# Patient Record
Sex: Male | Born: 1985 | Race: Black or African American | Hispanic: No | Marital: Single
Health system: Southern US, Community
[De-identification: ages and names within clinical notes are randomized; demographics above are authoritative.]

---

## 2018-09-20 ENCOUNTER — Other Ambulatory Visit: Payer: Self-pay

## 2018-09-20 ENCOUNTER — Encounter (HOSPITAL_COMMUNITY): Payer: Self-pay

## 2018-09-20 ENCOUNTER — Emergency Department (HOSPITAL_COMMUNITY)
Admission: EM | Admit: 2018-09-20 | Discharge: 2018-09-21 | Disposition: A | Discharge status: Home / Self Care | Payer: No Typology Code available for payment source | Attending: Emergency Medicine | Admitting: Emergency Medicine

## 2018-09-20 ENCOUNTER — Emergency Department (HOSPITAL_COMMUNITY): Payer: No Typology Code available for payment source

## 2018-09-20 DIAGNOSIS — Y939 Activity, unspecified: Secondary | ICD-10-CM | POA: Insufficient documentation

## 2018-09-20 DIAGNOSIS — S43214A Anterior dislocation of right sternoclavicular joint, initial encounter: Secondary | ICD-10-CM | POA: Diagnosis not present

## 2018-09-20 DIAGNOSIS — W01198A Fall on same level from slipping, tripping and stumbling with subsequent striking against other object, initial encounter: Secondary | ICD-10-CM | POA: Insufficient documentation

## 2018-09-20 DIAGNOSIS — Y990 Civilian activity done for income or pay: Secondary | ICD-10-CM | POA: Insufficient documentation

## 2018-09-20 DIAGNOSIS — Y9259 Other trade areas as the place of occurrence of the external cause: Secondary | ICD-10-CM | POA: Diagnosis not present

## 2018-09-20 DIAGNOSIS — S43014A Anterior dislocation of right humerus, initial encounter: Secondary | ICD-10-CM

## 2018-09-20 DIAGNOSIS — S40911A Unspecified superficial injury of right shoulder, initial encounter: Secondary | ICD-10-CM | POA: Diagnosis present

## 2018-09-20 MED ORDER — PROPOFOL 10 MG/ML IV BOLUS
0.5000 mg/kg | Freq: Once | INTRAVENOUS | Status: AC
  Administered 2018-09-20: 42 mg via INTRAVENOUS
  Filled 2018-09-20: qty 20

## 2018-09-20 MED ORDER — PROPOFOL 10 MG/ML IV BOLUS
INTRAVENOUS | Status: AC | PRN
  Administered 2018-09-20: 40 mg via INTRAVENOUS
  Administered 2018-09-20: 20 mg via INTRAVENOUS

## 2018-09-20 MED ORDER — FENTANYL CITRATE (PF) 100 MCG/2ML IJ SOLN
100.0000 ug | Freq: Once | INTRAMUSCULAR | Status: AC
  Administered 2018-09-20: 100 ug via INTRAVENOUS
  Filled 2018-09-20: qty 2

## 2018-09-20 NOTE — ED Triage Notes (Signed)
Pt coming in from work after falling c/o right shoulder pain. Unable to rotate shoulder. Able to feel and move fingers.

## 2018-09-20 NOTE — ED Provider Notes (Signed)
[facility]-EMERGENCY DEPT Provider Note   CSN: [id number] Arrival date & time: 09/20/18  2118     History   Chief Complaint Chief Complaint  Patient presents with  . Shoulder Pain    HPI Justin Moran is a 33 y.o. male with no pertinent past medical history who presents to the emergency department with a chief complaint of fall.  The patient reports that he was at work when he lost his footing and fell backwards against a wooden pallet.  He denies LOC, headache, nausea, or emesis, neck pain, numbness, or weakness.  He reports sudden onset right shoulder pain and deformity.  He states he is unable to rotate his right shoulder.  He thinks that he may have hit his right shoulder on the pallet. no history of previous surgery, injury, or dislocation.  No treatment prior to arrival.  The history is provided by the patient. No language interpreter was used.    History reviewed. No pertinent past medical history.  There are no active problems to display for this patient.   History reviewed. No pertinent surgical history.      Home Medications    Prior to Admission medications   Not on File    Family History No family history on file.  Social History Social History   Tobacco Use  . Smoking status: Not on file  Substance Use Topics  . Alcohol use: Not on file  . Drug use: Not on file     Allergies   Patient has no allergy information on record.   Review of Systems Review of Systems  Constitutional: Negative for appetite change and fever.  HENT: Negative for congestion.   Respiratory: Negative for shortness of breath and wheezing.   Cardiovascular: Negative for chest pain.  Gastrointestinal: Negative for abdominal pain, diarrhea, nausea and vomiting.  Genitourinary: Negative for dysuria.  Musculoskeletal: Positive for arthralgias, joint swelling and myalgias. Negative for back pain, neck pain and neck stiffness.  Skin: Negative for rash.    Allergic/Immunologic: Negative for immunocompromised state.  Neurological: Negative for syncope, weakness, numbness and headaches.  Psychiatric/Behavioral: Negative for confusion.     Physical Exam Updated Vital Signs BP 135/68   Pulse 72   Temp 97.7 F (36.5 C) (Oral)   Resp 14   Ht 6\' 1"  (1.854 m)   Wt 83.9 kg   SpO2 99%   BMI 24.41 kg/m   Physical Exam Vitals signs and nursing note reviewed.  Constitutional:      Appearance: He is well-developed.  HENT:     Head: Normocephalic.  Eyes:     Extraocular Movements: Extraocular movements intact.     Conjunctiva/sclera: Conjunctivae normal.     Pupils: Pupils are equal, round, and reactive to light.  Neck:     Musculoskeletal: Neck supple.  Cardiovascular:     Rate and Rhythm: Normal rate and regular rhythm.     Heart sounds: No murmur.  Pulmonary:     Effort: Pulmonary effort is normal. No respiratory distress.     Breath sounds: No stridor. No wheezing, rhonchi or rales.  Chest:     Chest wall: No tenderness.  Abdominal:     General: There is no distension.     Palpations: Abdomen is soft. There is no mass.     Tenderness: There is no abdominal tenderness. There is no right CVA tenderness, left CVA tenderness, guarding or rebound.     Hernia: No hernia is present.  Musculoskeletal:  General: Tenderness and deformity present.     Comments: Obvious deformity to the right shoulder.  Decreased range of motion secondary to deformity and pain.  No tenderness to the right elbow or wrist.  Radial pulses are 2+ and symmetric.  Good capillary refill to all digits of the right hand.  Sensation is intact and equal to the bilateral upper extremities.  Normal exam of the left upper extremity.  No tenderness to the cervical, thoracic, or lumbar spinous processes or paraspinal muscles bilaterally.  Skin:    General: Skin is warm and dry.  Neurological:     Mental Status: He is alert.  Psychiatric:        Behavior:  Behavior normal.    ED Treatments / Results  Labs (all labs ordered are listed, but only abnormal results are displayed) Labs Reviewed - No data to display  EKG None  Radiology Dg Shoulder Right  Result Date: 09/20/2018 CLINICAL DATA:  Fall.  Right shoulder pain EXAM: RIGHT SHOULDER - 2+ VIEW COMPARISON:  None. FINDINGS: There is anterior dislocation of the humeral head. No visible fracture. AC joint appears intact. IMPRESSION: Anterior right shoulder dislocation. Electronically Signed   By: Charlett Nose M.D.   On: 09/20/2018 22:34   Dg Shoulder Right Portable  Result Date: 09/21/2018 CLINICAL DATA:  Postreduction EXAM: PORTABLE RIGHT SHOULDER COMPARISON:  09/20/2018 FINDINGS: Interval reduction of the dislocated right glenohumeral joint. Normal AP alignment. No visible fracture. IMPRESSION: Interval reduction.  No visible fracture. Electronically Signed   By: Charlett Nose M.D.   On: 09/21/2018 00:49    Procedures Procedures (including critical care time)  Medications Ordered in ED Medications  ibuprofen (ADVIL,MOTRIN) tablet 600 mg (has no administration in time range)  fentaNYL (SUBLIMAZE) injection 100 mcg (100 mcg Intravenous Given 09/20/18 2232)  propofol (DIPRIVAN) 10 mg/mL bolus/IV push 42 mg (42 mg Intravenous Given 09/20/18 2345)  propofol (DIPRIVAN) 10 mg/mL bolus/IV push (20 mg Intravenous Given 09/20/18 2349)     Initial Impression / Assessment and Plan / ED Course  I have reviewed the triage vital signs and the nursing notes.  Pertinent labs & imaging results that were available during my care of the patient were reviewed by me and considered in my medical decision making (see chart for details).     33 year old male with no pertinent past medical history presenting with a chief complaint of fall.  He had a mechanical fall backwards and hit his right shoulder.  Right shoulder x-ray with anterior right shoulder dislocation.  On exam, he is neurovascularly intact.  Patient  unable to tolerate attempt at reduction without conscious sedation.  The patient was seen and evaluated along with Dr. Judd Lien, attending physician, who performed conscious sedation and right shoulder reduction.  Please see his note for complete procedure.  The patient was placed in a sling immobilizer.  Postreduction x-ray with interval reduction.  No visible fracture.  Patient is awake and talking.  Pain is mild, but he is requesting ibuprofen, which has been ordered.  Will provide the patient with a referral to orthopedics.  Strict return precautions given.  He is hemodynamically stable and remains neurovascularly intact.  He is safe for discharge to home with outpatient follow-up.   Final Clinical Impressions(s) / ED Diagnoses   Final diagnoses:  Anterior dislocation of right shoulder, initial encounter    ED Discharge Orders    None       Rawleigh Rode A, PA-C 09/21/18 0100    Delo, Riley Lam,  MD 09/21/18 [id number]

## 2018-09-20 NOTE — ED Notes (Signed)
Bed: WA04 Expected date:  Expected time:  Means of arrival:  Comments: 

## 2018-09-20 NOTE — ED Notes (Signed)
Pt given ice for shoulder and dressed into a gown.

## 2018-09-21 ENCOUNTER — Emergency Department (HOSPITAL_COMMUNITY): Payer: No Typology Code available for payment source

## 2018-09-21 MED ORDER — IBUPROFEN 200 MG PO TABS: 600.0000 mg | ORAL_TABLET | Freq: Once | ORAL | Status: DC

## 2018-09-21 NOTE — Discharge Instructions (Addendum)
Thank you for allowing me to care for you today in the Emergency Department.   Keep the sling on at all times until you are seen and evaluated early next week by orthopedics in the clinic.  You can take 650 mg of Tylenol or 600 mg of ibuprofen once every 6 hours or alternate between the 2 medications every 3 hours for pain control.  You can apply ice packs to areas that are sore for 15 to 20 minutes as frequently as needed.  Return to the emergency department if your right upper arm turns red and becomes very swollen, if you develop new numbness or weakness, have any fall or injury, if your right shoulder dislocates again, or if you develop other new, concerning symptoms.

## 2018-09-21 NOTE — ED Notes (Signed)
Portable xray at bedside.

## 2019-11-15 ENCOUNTER — Ambulatory Visit: Payer: Self-pay | Attending: Internal Medicine

## 2019-11-15 DIAGNOSIS — Z23 Encounter for immunization: Secondary | ICD-10-CM

## 2019-11-15 NOTE — Progress Notes (Signed)
   Covid-19 Vaccination Clinic  Name:  Macarthur Lorusso    MRN: [id number] DOB: November 18, 1985  11/15/2019  Mr. Macrae was observed post Covid-19 immunization for 15 minutes without incident. He was provided with Vaccine Information Sheet and instruction to access the V-Safe system.   Mr. Kats was instructed to call 911 with any severe reactions post vaccine: Marland Kitchen Difficulty breathing  . Swelling of face and throat  . A fast heartbeat  . A bad rash all over body  . Dizziness and weakness   Immunizations Administered    Name Date Dose VIS Date Route   Pfizer COVID-19 Vaccine 11/15/2019  1:55 PM 0.3 mL 07/25/2019 Intramuscular   Manufacturer: [employer], [employer]   Lot: [id number]   NDC: [address]-2

## 2019-12-10 ENCOUNTER — Ambulatory Visit: Payer: Self-pay | Attending: Internal Medicine

## 2019-12-10 DIAGNOSIS — Z23 Encounter for immunization: Secondary | ICD-10-CM

## 2019-12-10 NOTE — Progress Notes (Signed)
   Covid-19 Vaccination Clinic  Name:  Justin Moran    MRN: [id number] DOB: November 07, 1985  12/10/2019  [name] was observed post Covid-19 immunization for 15 minutes without incident. He was provided with Vaccine Information Sheet and instruction to access the V-Safe system.   [name] was instructed to call 911 with any severe reactions post vaccine: Marland Kitchen Difficulty breathing  . Swelling of face and throat  . A fast heartbeat  . A bad rash all over body  . Dizziness and weakness   Immunizations Administered    Name Date Dose VIS Date Route   Pfizer COVID-19 Vaccine 12/10/2019  4:18 PM 0.3 mL 10/08/2018 Intramuscular   Manufacturer: [employer], [employer]   Lot: [id number]   NDC: [address]-8

## 2020-04-12 IMAGING — DX DG SHOULDER 2+V PORT*R*
2 series · 2 of 2 positions shown · non-contrast
Comparison: 09/20/2018

CLINICAL DATA: Postreduction

EXAM:
PORTABLE RIGHT SHOULDER

[shoulder ap]
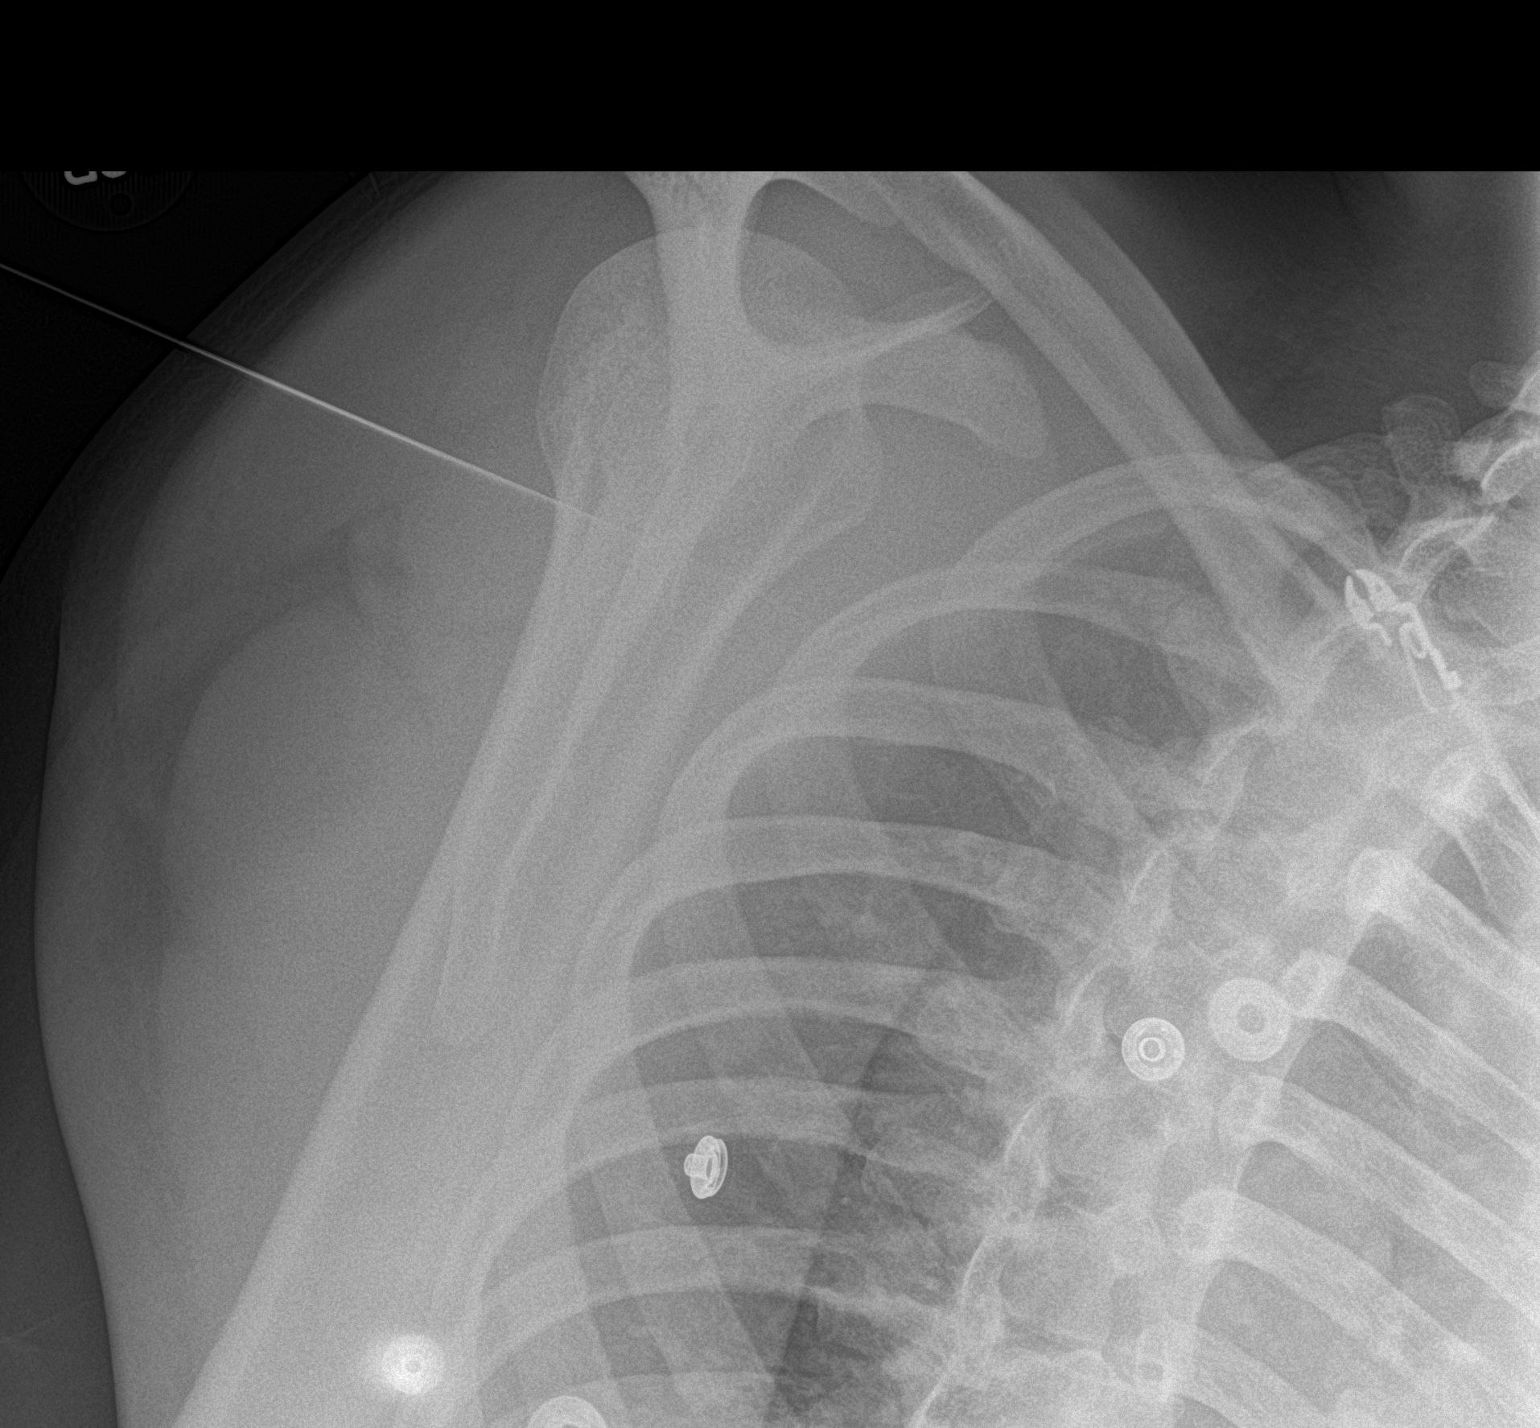

[shoulder obl]
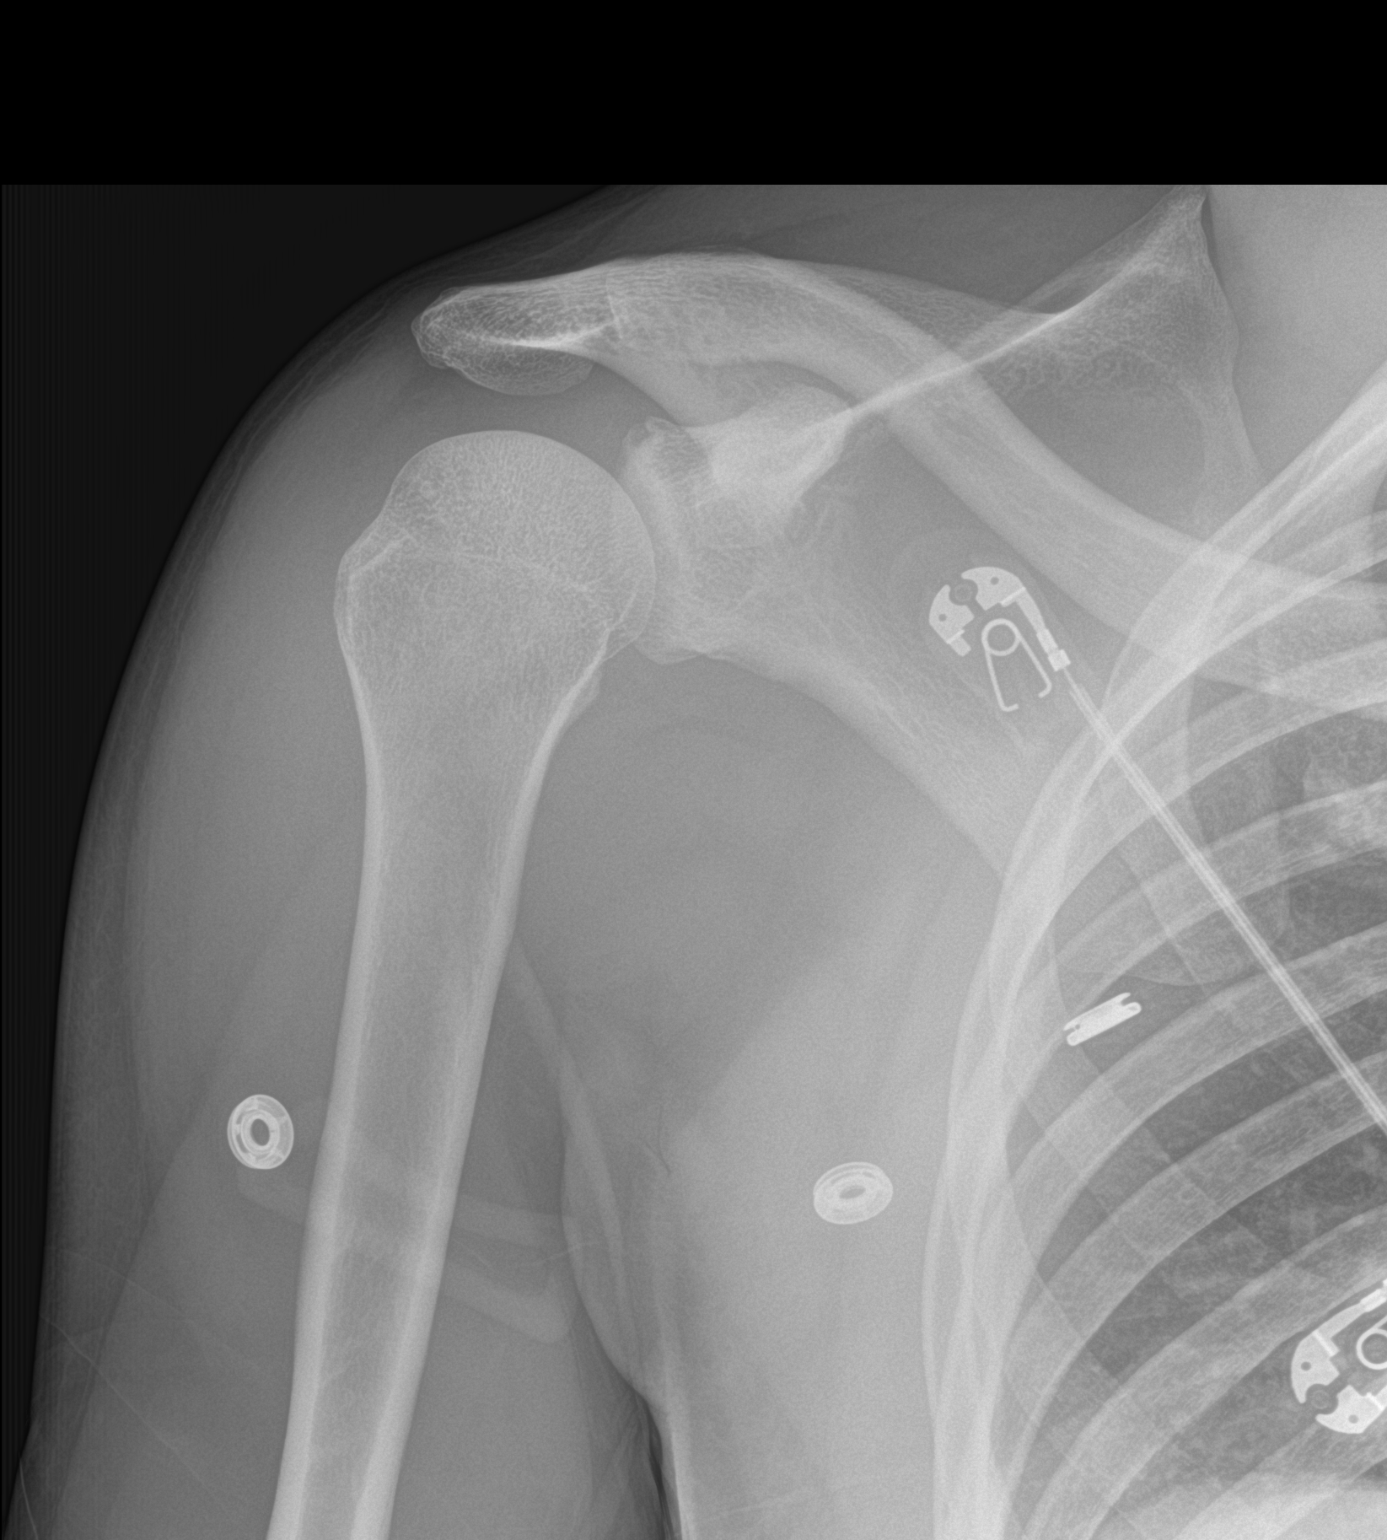

[2 of 2 positions shown; findings below may reference images not displayed]

FINDINGS: Interval reduction of the dislocated right glenohumeral joint.
Normal AP alignment. No visible fracture.
IMPRESSION: Interval reduction.  No visible fracture.

## 2020-07-30 ENCOUNTER — Ambulatory Visit: Payer: Self-pay

## 2020-07-31 ENCOUNTER — Ambulatory Visit: Payer: Self-pay | Attending: Internal Medicine

## 2020-07-31 DIAGNOSIS — Z23 Encounter for immunization: Secondary | ICD-10-CM

## 2020-07-31 NOTE — Progress Notes (Signed)
   Covid-19 Vaccination Clinic  Name:  Abelino Tippin    MRN: [id number] DOB: 1985-09-26  07/31/2020  Mr. Bozard was observed post Covid-19 immunization for 15 minutes without incident. He was provided with Vaccine Information Sheet and instruction to access the V-Safe system.   Mr. Verstraete was instructed to call 911 with any severe reactions post vaccine: Marland Kitchen Difficulty breathing  . Swelling of face and throat  . A fast heartbeat  . A bad rash all over body  . Dizziness and weakness   Immunizations Administered    Name Date Dose VIS Date Route   Pfizer COVID-19 Vaccine 07/31/2020 12:24 PM 0.3 mL 06/02/2020 Intramuscular   Manufacturer: [employer], [employer]   Lot: [id number]   NDC: [address]-5
